# Patient Record
Sex: Female | Born: 1935 | Race: White | Hispanic: No | Marital: Married | Smoking: Former smoker
Health system: Southern US, Community
[De-identification: ages and names within clinical notes are randomized; demographics above are authoritative.]

## PROBLEM LIST (undated history)

## (undated) DIAGNOSIS — I10 Essential (primary) hypertension: Secondary | ICD-10-CM

## (undated) DIAGNOSIS — M2550 Pain in unspecified joint: Secondary | ICD-10-CM

## (undated) DIAGNOSIS — J84112 Idiopathic pulmonary fibrosis: Secondary | ICD-10-CM

## (undated) HISTORY — DX: Idiopathic pulmonary fibrosis: J84.112

## (undated) HISTORY — DX: Pain in unspecified joint: M25.50

## (undated) HISTORY — DX: Essential (primary) hypertension: I10

---

## 2017-11-15 DIAGNOSIS — R9389 Abnormal findings on diagnostic imaging of other specified body structures: Secondary | ICD-10-CM | POA: Insufficient documentation

## 2017-11-15 DIAGNOSIS — M2550 Pain in unspecified joint: Secondary | ICD-10-CM | POA: Insufficient documentation

## 2017-11-28 ENCOUNTER — Telehealth: Payer: Self-pay | Admitting: Internal Medicine

## 2017-11-28 NOTE — Telephone Encounter (Signed)
Patient calling to see if we can possibly schedule bronch same day as appt as she has transportation issues .  Please call

## 2017-11-28 NOTE — Telephone Encounter (Signed)
Returned call to patient and made aware that scheduling bronch for same day as appt is not an option. Explained we request a date that has to be available on hospital schedule. Patient verbalized understanding.

## 2017-12-04 ENCOUNTER — Ambulatory Visit: Payer: Medicare HMO | Admitting: Internal Medicine

## 2017-12-04 ENCOUNTER — Encounter: Payer: Self-pay | Admitting: *Deleted

## 2017-12-04 ENCOUNTER — Other Ambulatory Visit: Payer: Self-pay | Admitting: *Deleted

## 2017-12-04 ENCOUNTER — Encounter: Payer: Self-pay | Admitting: Internal Medicine

## 2017-12-04 VITALS — BP 126/78 | HR 121 | Resp 16 | Wt 162.0 lb

## 2017-12-04 DIAGNOSIS — M791 Myalgia, unspecified site: Secondary | ICD-10-CM

## 2017-12-04 DIAGNOSIS — J84112 Idiopathic pulmonary fibrosis: Secondary | ICD-10-CM | POA: Diagnosis not present

## 2017-12-04 NOTE — Patient Instructions (Addendum)
Will need to get a copy of your most recent CT chest on CD then will determine need for bronchoscopy.  We will call you after this is reviewed to discuss further plan.  What is idiopathic pulmonary fibrosis? Idiopathic pulmonary fibrosis (also called "IPF") is a lung disease that makes it hard to breathe. It damages the air sacs in your lungs that send oxygen to the blood. This damage causes the lungs to be stiff. It also makes it hard for oxygen to reach the blood. This makes people with IPF cough and get short of breath.  People who get IPF are usually older than 50. It is a very serious illness that cannot be cured and gets worse over time. In some people, IPF can stay stable for several years before getting worse, or get worse gradually. But in others it gets worse more quickly.  Why did I get idiopathic pulmonary fibrosis? Doctors do not know exactly how IPF starts. The risk of IPF is greater for people who:  ?Smoke or used to smoke  ?Have breathed in a lot of toxic chemicals or pollution  ?Have breathed in certain types of dust at work, over a long time  ?Have family members with pulmonary fibrosis  What are the symptoms of idiopathic pulmonary fibrosis? IPF can start very slowly, so it might not cause any symptoms at first. When it does, symptoms can include:  ?Shortness of breath during exercise or other physical activity  ?Dry cough  Are there tests for idiopathic pulmonary fibrosis? Yes. Doctors can do:  ?Blood tests to make sure you don't have a different type of lung disease - There is no blood test for IPF.  ?Breathing tests to see how well your lungs are working - Breathing tests can show if your shortness of breath is caused by IPF or another disease such as emphysema.  ?An imaging test called a "CT scan" - This test uses a special X-ray to create pictures of the inside of the body. It can show lung damage caused by IPF.  ?If the doctor is not sure you have IPF after  the CT scan, he or she might do a lung biopsy. In this test, a doctor does surgery to take a small sample of tissue from your lung. Another doctor looks at the sample under a microscope for signs of IPF.  How is idiopathic pulmonary fibrosis treated? There is no treatment to cure IPF. It usually gets worse slowly. But doctors can treat some of the symptoms. These treatments can include:  ?Quitting smoking - If you smoke cigarettes, the most important thing you can do is stop smoking.  ?Flu and pneumonia vaccines - You should get the flu shot every fall and the pneumonia vaccine at least once. Infections like the flu and pneumonia can hurt your lungs. It's important to try to prevent them.  ?Oxygen - As IPF gets worse, some people need to breathe oxygen from a tank they carry with them.  ?Pulmonary rehab - In pulmonary rehab, people learn exercises and ways to breathe that can help with IPF symptoms.  ?Medicine - Two medicines, nintedanib (brand name: Ofev) and pirfenidone (brand name: Esbriet), have been shown to slow lung damage. But they do not cure IPF.  ?Clinical trials - If you are interested, you can join a research study of new medicines that might help people with IPF.  ?Treatment for acid reflux - Acid reflux is when the acid that is normally in your stomach  backs up into your esophagus. The esophagus is the tube that carries food from your mouth to your stomach. People who have acid reflux might need medicine to stop the acid reflux from making IPF worse.  ?Lung transplant - This is surgery to replace 1 or both diseased lungs with healthy lungs. It is done only if a person with IPF meets certain conditions.  IPF sometimes gets worse very quickly, over a few days to weeks. If this happens, you should tell your doctor. Doctors can try to treat it with antibiotics and steroid medicines. (These are not the same as the steroids some athletes take illegally.)

## 2017-12-04 NOTE — H&P (View-Only) (Signed)
[facility] Consultation      Assessment and Plan:  Pulmonary fibrosis. -Etiology uncertain, however per CT report this is consistent with usual interstitial pneumonia pattern, consistent with idiopathic pulmonary fibrosis. -Patient is referred here for consideration of biopsy, she is very hesitant to undergo this procedure but is willing if we feel that results will alter her therapy. -I have asked her to obtain a CT on CD for me to review, after this is reviewed I will call her for setting up potentially of a bronchoscopy.  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 -Elevated p-ANCA which is concerning for vasculitis/Wegener's. Pt denies hemoptysis.  -Currently on prednisone 20 mg daily with resolution of cough and myalgias. - Patient is following up with Dr. Renard MatterBock , rheumatology.  Addendum 12/06/17: Outside noncontrasted CT chest images personally reviewed there are bibasilar subpleural honeycomb fibrotic changes consistent with pulmonary fibrosis, left base bullous changes, some subpleural upper lobe changes, as well as areas of groundglass changes, appear greatest in the lingula and right upper lobe. Discussed yesterday with rheum, will arrange for bronchoscopy with biopsy to r/o infection, other atypical causes of inflammation.    Date: 12/04/2017  MRN# [id number] [name] 08/27/1936  Referring Physician: Dr. Meredeth IdeFleming.   [name] is a 82 y.o. old female seen in consultation for chief complaint of:    Chief Complaint  Patient presents with  . Consult    referred by Dr. Meredeth IdeFleming lymph node, UIP work-up.  Marland Kitchen. Cough    sl. now that she takes Prednisone 20 mg daily.  . Shortness of Breath    with and without exertion    HPI:   The patient-year-old female ex-smoker, who moved here from [place] approximately 1 year ago.  Recently she underwent a CT of the chest she has been having symptoms of  cough and progressive dyspnea. CT of the chest was  suggestive of pulmonary fibrosis, she was seen by rheumatology, and noted to have an elevated p-ANCA and MPO levels, with concern for Wegener's granulomatosis.  She is therefore referred here for consideration of bronchoscopy. She notes that over the past 6 weeks she developed back and neck stiffness, significant muscle pain and stiffness.  This coincided with the development of a new cough, she saw her PCP and was suspected of having MCTD, she was referred to rheumatologist, serological workup was positive for p-ANCA.  In the meantime she has been started on a prednisone burst and been maintained on 20 mg daily.  She notes that since she was started on this her breathing, cough and muscle aches have significantly improved. Currently she describes as herself as very anxious.  She is worried that she might have to use oxygen.  She quit smoking 40 years ago.  [facility], and [facility]R was at [facility] was within the past month.  She walks her dog about 3 times per weekly. She also goes to the gym about 5 days per week, she is not limited by breathing.   **Review of pulmonary function test on 11/15/17 shows relatively well-preserved lung functions with mild restriction but severely reduced DLCO of 30%.  **Desat walk sat at rest on RA was 94% and HR 95; Walked 360 feet at moderate pace, no dyspnea, sat was 91% and HR 112.  **Report of CT chest 11/20/17 bilateral lower lobe predominant subpleural reticular pulmonary parenchymal opacities bronchiectasis, honeycombing noted, small hiatal hernia, mild cardiomegaly.  Subcentimeter mediastinal lymph nodes.  Consistent with UIP pattern of interstitial lung disease. **Serology TB QuantiFERON negative, sed rate elevated at 119, ACE level normal, rheumatoid factor negative, double-stranded DNA, normal.  Anti-RNP elevated at 265 (<100).  C-ANCA negative, anti-proteinase PR-3 negative, p-ANCA elevated at 1: 320 (<1:20).   PMHX:     History reviewed. No pertinent past medical history. Surgical Hx:  History reviewed. No pertinent surgical history. Family Hx:  History reviewed. No pertinent family history. Social Hx:   Social History   Tobacco Use  . Smoking status: Former Smoker    Packs/day: 1.00    Types: Cigarettes    Last attempt to quit: 09/06/1987    Years since quitting: 30.2  . Smokeless tobacco: Never Used  Substance Use Topics  . Alcohol use: Never    Frequency: Never  . Drug use: Never   Medication:    Current Outpatient Medications:  .  hydrochlorothiazide (HYDRODIURIL) 25 MG tablet, Take 25 mg by mouth daily., Disp: , Rfl: 0 .  predniSONE (DELTASONE) 20 MG tablet, Take 20 mg by mouth daily., Disp: , Rfl:    Allergies:  Patient has no known allergies.  Review of Systems: Gen:  Denies  fever, sweats, chills HEENT: Denies blurred vision, double vision. bleeds, sore throat Cvc:  No dizziness, chest pain. Resp:   Denies cough or sputum production, shortness of breath Gi: Denies swallowing difficulty, stomach pain. Gu:  Denies bladder incontinence, burning urine Ext:   No Joint pain, stiffness. Skin: No skin rash,  hives  Endoc:  No polyuria, polydipsia. Psych: No depression, insomnia. Other:  All other systems were reviewed with the patient and were negative other that what is mentioned in the HPI.   Physical Examination:   VS: BP 126/78 (BP Location: Left Arm, Cuff Size: Normal)   Pulse (!) 121   Resp 16   Wt 162 lb (73.5 kg)   SpO2 93%   General Appearance: No distress  Neuro:without focal findings,  speech normal,  HEENT: PERRLA, EOM intact.   Pulmonary: Scattered bibasilar crackles. CardiovascularNormal S1,S2.  No m/r/g.   Abdomen: Benign, Soft, non-tender. Renal:  No costovertebral tenderness  GU:  No performed at this time. Endoc: No evident thyromegaly, no signs of acromegaly. Skin:   warm, no rashes, no ecchymosis  Extremities: normal, no cyanosis, clubbing.  Other  findings:    LABORATORY PANEL:   CBC No results for input(s): WBC, HGB, HCT, PLT in the last 168 hours. ------------------------------------------------------------------------------------------------------------------  Chemistries  No results for input(s): NA, K, CL, CO2, GLUCOSE, BUN, CREATININE, CALCIUM, MG, AST, ALT, ALKPHOS, BILITOT in the last 168 hours.  Invalid input(s): GFRCGP ------------------------------------------------------------------------------------------------------------------  Cardiac Enzymes No results for input(s): TROPONINI in the last 168 hours. ------------------------------------------------------------  RADIOLOGY:  No results found.     Thank  you for the consultation and for allowing [facility] to assist in the care of your patient. Our recommendations are noted above.  Please contact us if we can be of further service.   Wells Guiles, MD.  Board Certified in Internal Medicine, Pulmonary Medicine, Critical Care Medicine, and Sleep Medicine.  [place] [facility] Office Number: [phone]  Santiago Glad, M.D.  Billy Fischer, M.D  12/04/2017

## 2017-12-04 NOTE — Progress Notes (Addendum)
[facility] Consultation      Assessment and Plan:  Pulmonary fibrosis. -Etiology uncertain, however per CT report this is consistent with usual interstitial pneumonia pattern, consistent with idiopathic pulmonary fibrosis. -Patient is referred here for consideration of biopsy, she is very hesitant to undergo this procedure but is willing if we feel that results will alter her therapy. -I have asked her to obtain a CT on CD for me to review, after this is reviewed I will call her for setting up potentially of a bronchoscopy.  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 -Elevated p-ANCA which is concerning for vasculitis/Wegener's. Pt denies hemoptysis.  -Currently on prednisone 20 mg daily with resolution of cough and myalgias. - Patient is following up with Dr. Renard MatterBock , rheumatology.  Addendum 12/06/17: Outside noncontrasted CT chest images personally reviewed there are bibasilar subpleural honeycomb fibrotic changes consistent with pulmonary fibrosis, left base bullous changes, some subpleural upper lobe changes, as well as areas of groundglass changes, appear greatest in the lingula and right upper lobe. Discussed yesterday with rheum, will arrange for bronchoscopy with biopsy to r/o infection, other atypical causes of inflammation.    Date: 12/04/2017  MRN# [id number] Alyssa Shannon 08/27/1936  Referring Physician: Dr. Meredeth IdeFleming.   Alyssa Shannon is a 82 y.o. old female seen in consultation for chief complaint of:    Chief Complaint  Patient presents with  . Consult    referred by Dr. Meredeth IdeFleming lymph node, UIP work-up.  Marland Kitchen. Cough    sl. now that she takes Prednisone 20 mg daily.  . Shortness of Breath    with and without exertion    HPI:   The patient-year-old female ex-smoker, who moved here from [place] approximately 1 year ago.  Recently she underwent a CT of the chest she has been having symptoms of  cough and progressive dyspnea. CT of the chest was  suggestive of pulmonary fibrosis, she was seen by rheumatology, and noted to have an elevated p-ANCA and MPO levels, with concern for Wegener's granulomatosis.  She is therefore referred here for consideration of bronchoscopy. She notes that over the past 6 weeks she developed back and neck stiffness, significant muscle pain and stiffness.  This coincided with the development of a new cough, she saw her PCP and was suspected of having MCTD, she was referred to rheumatologist, serological workup was positive for p-ANCA.  In the meantime she has been started on a prednisone burst and been maintained on 20 mg daily.  She notes that since she was started on this her breathing, cough and muscle aches have significantly improved. Currently she describes as herself as very anxious.  She is worried that she might have to use oxygen.  She quit smoking 40 years ago.  She had [facility], and CXR was at [facility] [place], [place] was within the past month.  She walks her dog about 3 times per weekly. She also goes to the gym about 5 days per week, she is not limited by breathing.   **Review of pulmonary function test on 11/15/17 shows relatively well-preserved lung functions with mild restriction but severely reduced DLCO of 30%.  **Desat walk sat at rest on RA was 94% and HR 95; Walked 360 feet at moderate pace, no dyspnea, sat was 91% and HR 112.  **Report of CT chest 11/20/17 bilateral lower lobe predominant subpleural reticular pulmonary parenchymal opacities bronchiectasis, honeycombing noted, small hiatal hernia, mild cardiomegaly.  Subcentimeter mediastinal lymph nodes.  Consistent with UIP pattern of interstitial lung disease. **Serology TB QuantiFERON negative, sed rate elevated at 119, ACE level normal, rheumatoid factor negative, double-stranded DNA, normal.  Anti-RNP elevated at 265 (<100).  C-ANCA negative, anti-proteinase PR-3 negative, p-ANCA elevated at 1: 320 (<1:20).   PMHX:     History reviewed. No pertinent past medical history. Surgical Hx:  History reviewed. No pertinent surgical history. Family Hx:  History reviewed. No pertinent family history. Social Hx:   Social History   Tobacco Use  . Smoking status: Former Smoker    Packs/day: 1.00    Types: Cigarettes    Last attempt to quit: 09/06/1987    Years since quitting: 30.2  . Smokeless tobacco: Never Used  Substance Use Topics  . Alcohol use: Never    Frequency: Never  . Drug use: Never   Medication:    Current Outpatient Medications:  .  hydrochlorothiazide (HYDRODIURIL) 25 MG tablet, Take 25 mg by mouth daily., Disp: , Rfl: 0 .  predniSONE (DELTASONE) 20 MG tablet, Take 20 mg by mouth daily., Disp: , Rfl:    Allergies:  Patient has no known allergies.  Review of Systems: Gen:  Denies  fever, sweats, chills HEENT: Denies blurred vision, double vision. bleeds, sore throat Cvc:  No dizziness, chest pain. Resp:   Denies cough or sputum production, shortness of breath Gi: Denies swallowing difficulty, stomach pain. Gu:  Denies bladder incontinence, burning urine Ext:   No Joint pain, stiffness. Skin: No skin rash,  hives  Endoc:  No polyuria, polydipsia. Psych: No depression, insomnia. Other:  All other systems were reviewed with the patient and were negative other that what is mentioned in the HPI.   Physical Examination:   VS: BP 126/78 (BP Location: Left Arm, Cuff Size: Normal)   Pulse (!) 121   Resp 16   Wt 162 lb (73.5 kg)   SpO2 93%   General Appearance: No distress  Neuro:without focal findings,  speech normal,  HEENT: PERRLA, EOM intact.   Pulmonary: Scattered bibasilar crackles. CardiovascularNormal S1,S2.  No m/r/g.   Abdomen: Benign, Soft, non-tender. Renal:  No costovertebral tenderness  GU:  No performed at this time. Endoc: No evident thyromegaly, no signs of acromegaly. Skin:   warm, no rashes, no ecchymosis  Extremities: normal, no cyanosis, clubbing.  Other  findings:    LABORATORY PANEL:   CBC No results for input(s): WBC, HGB, HCT, PLT in the last 168 hours. ------------------------------------------------------------------------------------------------------------------  Chemistries  No results for input(s): NA, K, CL, CO2, GLUCOSE, BUN, CREATININE, CALCIUM, MG, AST, ALT, ALKPHOS, BILITOT in the last 168 hours.  Invalid input(s): GFRCGP ------------------------------------------------------------------------------------------------------------------  Cardiac Enzymes No results for input(s): TROPONINI in the last 168 hours. ------------------------------------------------------------  RADIOLOGY:  No results found.     Thank  you for the consultation and for allowing [facility] to assist in the care of your patient. Our recommendations are noted above.  Please contact us if we can be of further service.   Wells Guiles, MD.  Board Certified in Internal Medicine, Pulmonary Medicine, Critical Care Medicine, and Sleep Medicine.  [place] [facility] Office Number: [phone]  Santiago Glad, M.D.  Billy Fischer, M.D  12/04/2017

## 2017-12-06 ENCOUNTER — Telehealth: Payer: Self-pay | Admitting: *Deleted

## 2017-12-06 NOTE — Telephone Encounter (Signed)
-----   Message from Shane CrutchPradeep Ramachandran, MD sent at 12/06/2017  1:56 PM EDT ----- Regarding: bronchoscopy Reviewed CT scans. Will need bronchoscopy Non-EBUS, with fluoro. Orders placed.

## 2017-12-06 NOTE — Addendum Note (Signed)
Addended by: Shane CrutchAMACHANDRAN, Drina Jobst on: 12/06/2017 01:55 PM   Modules accepted: Orders, SmartSet

## 2017-12-06 NOTE — Telephone Encounter (Signed)
PROVIDER: Nicholos Johnsamachandran    PROCEDURE: regular DATE: 12/12/17 TIME: 1pm

## 2017-12-07 NOTE — Telephone Encounter (Signed)
Called [employer] per [employer] No PA required for procedure code [id number]. Call Ref # [id number] [id number]. Rhonda J Cobb

## 2017-12-10 ENCOUNTER — Telehealth: Payer: Self-pay | Admitting: Internal Medicine

## 2017-12-10 NOTE — Telephone Encounter (Signed)
Patient wants to schedule bronch if she can do this Friday she has transportation .  Please call.

## 2017-12-10 NOTE — Telephone Encounter (Signed)
Spoke with pt and informed her that her Regular bronch has been rescheduled for 12/18/17 and she must [facility] at 12[facility]. Nothing further needed.

## 2017-12-18 ENCOUNTER — Ambulatory Visit: Payer: Medicare HMO

## 2017-12-18 ENCOUNTER — Encounter
Admission: RE | Disposition: A | Discharge status: Home / Self Care | Payer: Self-pay | Source: Ambulatory Visit | Attending: Internal Medicine

## 2017-12-18 ENCOUNTER — Ambulatory Visit
Admission: RE | Admit: 2017-12-18 | Discharge: 2017-12-18 | Disposition: A | Discharge status: Home / Self Care | Payer: Medicare HMO | Source: Ambulatory Visit | Attending: Internal Medicine | Admitting: Internal Medicine

## 2017-12-18 DIAGNOSIS — Z79899 Other long term (current) drug therapy: Secondary | ICD-10-CM | POA: Insufficient documentation

## 2017-12-18 DIAGNOSIS — J84112 Idiopathic pulmonary fibrosis: Secondary | ICD-10-CM

## 2017-12-18 DIAGNOSIS — J209 Acute bronchitis, unspecified: Secondary | ICD-10-CM | POA: Insufficient documentation

## 2017-12-18 DIAGNOSIS — J8410 Pulmonary fibrosis, unspecified: Secondary | ICD-10-CM | POA: Insufficient documentation

## 2017-12-18 DIAGNOSIS — Z7952 Long term (current) use of systemic steroids: Secondary | ICD-10-CM | POA: Insufficient documentation

## 2017-12-18 DIAGNOSIS — R790 Abnormal level of blood mineral: Secondary | ICD-10-CM | POA: Insufficient documentation

## 2017-12-18 DIAGNOSIS — Z9889 Other specified postprocedural states: Secondary | ICD-10-CM

## 2017-12-18 DIAGNOSIS — Z87891 Personal history of nicotine dependence: Secondary | ICD-10-CM | POA: Insufficient documentation

## 2017-12-18 HISTORY — PX: FLEXIBLE BRONCHOSCOPY: SHX5094

## 2017-12-18 HISTORY — DX: Essential (primary) hypertension: I10

## 2017-12-18 SURGERY — BRONCHOSCOPY, FLEXIBLE
Anesthesia: Moderate Sedation | Laterality: Left

## 2017-12-18 MED ORDER — FENTANYL CITRATE (PF) 100 MCG/2ML IJ SOLN
INTRAMUSCULAR | Status: AC | PRN
  Administered 2017-12-18 (×6): 25 ug via INTRAVENOUS

## 2017-12-18 MED ORDER — LIDOCAINE HCL 2 % EX GEL
1.0000 "application " | Freq: Once | CUTANEOUS | Status: DC
  Filled 2017-12-18: qty 5

## 2017-12-18 MED ORDER — PHENYLEPHRINE HCL 0.25 % NA SOLN
1.0000 | Freq: Four times a day (QID) | NASAL | Status: DC | PRN
  Filled 2017-12-18: qty 15

## 2017-12-18 MED ORDER — FENTANYL CITRATE (PF) 100 MCG/2ML IJ SOLN
INTRAMUSCULAR | Status: AC
  Filled 2017-12-18: qty 4

## 2017-12-18 MED ORDER — MIDAZOLAM HCL 5 MG/5ML IJ SOLN
INTRAMUSCULAR | Status: AC | PRN
  Administered 2017-12-18 (×6): 1 mg via INTRAVENOUS

## 2017-12-18 MED ORDER — BUTAMBEN-TETRACAINE-BENZOCAINE 2-2-14 % EX AERO
1.0000 | INHALATION_SPRAY | Freq: Once | CUTANEOUS | Status: DC
  Filled 2017-12-18: qty 20

## 2017-12-18 MED ORDER — MIDAZOLAM HCL 5 MG/5ML IJ SOLN
INTRAMUSCULAR | Status: AC
  Filled 2017-12-18: qty 10

## 2017-12-18 NOTE — Procedures (Signed)
  [place] [facility]            Bronchoscopy Note   FINDINGS/SUMMARY:  -Findings of severe erythema/bronchitis throughout both airways. -An area of mildly abnormal/thickened mucosa was found in the right lower lobe, endobronchial biopsies were taken at this location. - Bronchoalveolar lavage was taken of the right middle lobe. - Transbronchial forceps biopsies, transbronchial brushings were taken of the right upper lobe. -Crowded posterior pharynx suspicious for obstructive sleep apnea.  Indication: pulmonary fibrosis.  The patient (or their representative) was informed of the risks (including but not limited to bleeding, infection, respiratory failure, lung injury, tooth/oral injury) and benefits of the procedure and gave consent, see chart.   Pre-op diagnosis: Pulmonary fibrosis Post-op diagnosis: Same with acute bronchitis. Estimated blood loss: Minimal  Medications for procedure: Patient was treated with conscious sedation, received a total of 6 mg of Versed, 100 mg of fentanyl.  I was present and supervised conscious sedation for the duration of the procedure total sedation time was 30 minutes.  Procedure description: After obtaining informed consent, timeout was called to confirm the patient and the procedure.  Right and left nares were checked for patency, the left nares appear to be more patent and was anesthetized with topical lidocaine.  Bronchoscope was passed via the left nares to the posterior pharynx, narrow opening of the posterior pharynx and the vocal cords was noted, likely consistent with obstructive sleep apnea. An anatomical tumor was undertaken, there was moderate mucosal erythema, there was bilateral mucosal secretions which were suctioned.  There was some thickened mucosa at the entrance to the basilar segments of the right lower lobe, endobronchial forceps biopsies were taken from here x2. Subsequently bronchoscope was taken of the right middle lobe,  bronchoscope was wedged in the middle lobe, bronchoalveolar lavage was performed from the right middle lobe.  Right skin was then taken to the right upper lobe, anterior segment.  Under fluoroscopic guidance transbronchial cytology brushings were taken, as well as transbronchial forceps biopsies under fluoroscopic guidance.  As adequate tissue sampling had been performed at that time, the bronchoscope was removed, the patient was taken to recovery.  No complications were noted.   Condition post procedure: Stable   Complications: None noted  Patient instructions: Patient is asked to follow-up with Dr. Raul Del and Dr. Meda Coffee for results.   Marda Stalker, MD.  Board Certified in Internal Medicine, Pulmonary Medicine, [place], and Sleep Medicine.   Pulmonary and Critical Care Office Number: [phone]  Shaneil Pesa, M.D.  Vilinda Boehringer, M.D.  Cheral Marker, M.D  12/18/2017

## 2017-12-18 NOTE — Progress Notes (Signed)
Pt CXR completed at bedside. Dr. Nicholos Johnsamachandran paged, MD reviewed CXR. Per MD ok to d/c home once PO2 weaned and stable.

## 2017-12-18 NOTE — Interval H&P Note (Signed)
History and Physical Interval Note:  12/18/2017 12:11 PM  Alyssa Shannon  has presented today for surgery, with the diagnosis of Reg Bronchoscopy  Cough  [facility] emailed  cc: [name]  The various methods of treatment have been discussed with the patient and family. After consideration of risks, benefits and other options for treatment, the patient has consented to  Procedure(s): FLEXIBLE BRONCHOSCOPY (Left) as a surgical intervention .  The patient's history has been reviewed, patient examined, no change in status, stable for surgery.  I have reviewed the patient's chart and labs.  Questions were answered to the patient's satisfaction.     [name]

## 2017-12-19 ENCOUNTER — Encounter: Payer: Self-pay | Admitting: Internal Medicine

## 2017-12-19 LAB — ACID FAST SMEAR (AFB, MYCOBACTERIA): Acid Fast Smear: NEGATIVE

## 2017-12-19 LAB — ACID FAST SMEAR (AFB)

## 2017-12-20 LAB — SURGICAL PATHOLOGY

## 2017-12-20 LAB — CYTOLOGY - NON PAP

## 2017-12-20 LAB — CULTURE, BAL-QUANTITATIVE W GRAM STAIN: Culture: 50000 — AB

## 2017-12-20 LAB — CULTURE, BAL-QUANTITATIVE

## 2017-12-25 ENCOUNTER — Telehealth: Payer: Self-pay | Admitting: Cardiothoracic Surgery

## 2017-12-25 ENCOUNTER — Telehealth: Payer: Self-pay | Admitting: Internal Medicine

## 2017-12-25 ENCOUNTER — Other Ambulatory Visit: Payer: Self-pay

## 2017-12-25 DIAGNOSIS — J84112 Idiopathic pulmonary fibrosis: Secondary | ICD-10-CM

## 2017-12-25 DIAGNOSIS — R768 Other specified abnormal immunological findings in serum: Secondary | ICD-10-CM | POA: Insufficient documentation

## 2017-12-25 DIAGNOSIS — I10 Essential (primary) hypertension: Secondary | ICD-10-CM | POA: Insufficient documentation

## 2017-12-25 HISTORY — DX: Idiopathic pulmonary fibrosis: J84.112

## 2017-12-25 HISTORY — DX: Essential (primary) hypertension: I10

## 2017-12-25 NOTE — Telephone Encounter (Signed)
Follow up  [facility] called for patient stating patient cannot get records and see if we can call MR to obtain copy.

## 2017-12-25 NOTE — Telephone Encounter (Signed)
Called [facility] imaging to see if they still had disc that was dropped of to be scanned into patient chart from [facility]. Spoke with French Anaracy who states they don't have disc.  Called patient to see if she can obtain another copy before appointment with Dr. Thelma Bargeaks on Thursday. Patient states she will try her best to obtain another copy.  Nothing further needed.

## 2017-12-25 NOTE — Telephone Encounter (Signed)
New Message  Maritza from [facility] wanting to know if they can get CD of patient's CT Scan.  Please f/u

## 2017-12-26 ENCOUNTER — Ambulatory Visit: Payer: Medicare HMO

## 2017-12-26 NOTE — Telephone Encounter (Signed)
Pt notified that should will need another CT scan if she if not able to get 11/14/17 Ct scan on disc.  She states she will get her husband to obtain disc for her. Nothing further needed.

## 2017-12-27 ENCOUNTER — Encounter: Payer: Self-pay | Admitting: Cardiothoracic Surgery

## 2017-12-27 ENCOUNTER — Ambulatory Visit
Admission: RE | Admit: 2017-12-27 | Discharge: 2017-12-27 | Disposition: A | Discharge status: Home / Self Care | Payer: Self-pay | Source: Ambulatory Visit | Attending: Cardiothoracic Surgery | Admitting: Cardiothoracic Surgery

## 2017-12-27 ENCOUNTER — Ambulatory Visit (INDEPENDENT_AMBULATORY_CARE_PROVIDER_SITE_OTHER): Payer: Medicare HMO | Admitting: Cardiothoracic Surgery

## 2017-12-27 ENCOUNTER — Other Ambulatory Visit: Payer: Self-pay | Admitting: Cardiothoracic Surgery

## 2017-12-27 VITALS — BP 162/89 | HR 88 | Temp 97.5°F | Resp 18 | Ht 62.0 in | Wt 164.4 lb

## 2017-12-27 DIAGNOSIS — J84112 Idiopathic pulmonary fibrosis: Secondary | ICD-10-CM

## 2017-12-27 DIAGNOSIS — Z9289 Personal history of other medical treatment: Secondary | ICD-10-CM

## 2017-12-27 NOTE — Progress Notes (Signed)
Patient ID: Alyssa Shannon, female   DOB: October 22, 1935, 82 y.o.   MRN: [id number]  Chief Complaint  Patient presents with  . New Patient (Initial Visit)    Idiopathic Pulmonary Fibrosis-referred by Dr.Flemmimg    Referred By Dr. Raul Del Reason for Referral thoracoscopic lung biopsy HPI Location, Quality, Duration, Severity, Timing, Context, Modifying Factors, Associated Signs and Symptoms.  Alyssa Shannon is a 82 y.o. female.  Her problems began back in December when she came back from a trip.  She started noticing that she was increasingly short of breath and thought she had a cold.  She went to her primary care physician and received symptomatic therapy but then over the course of a couple weeks she began experiencing joint pains as well as pains in her neck.  She was ultimately seen by rheumatologist and a rheumatologic work-up ensued.  She did have evidence of autoimmune disease and as part of her therapy received a chest x-ray and CT scan.  The CT scan was consistent with IPF and a subsequent referral to Dr. Raul Del resulted in pulmonary function studies revealing a markedly diminished DLCO.  This was all consistent with her diagnosis of UIP.  She is here today to discuss lung biopsy.  She is previously undergone a transbronchial lung biopsy which was negative for any evidence of vasculitis.  This was performed by Dr. Ashby Dawes approximately 3 weeks ago.  She was subsequently placed on prednisone and states that she feels much better on that medication and that her shortness of breath has improved.   Past Medical History:  Diagnosis Date  . Arthralgia   . Essential hypertension 12/25/2017  . Hypertension   . Idiopathic pulmonary fibrosis ([place]) 12/25/2017    Past Surgical History:  Procedure Laterality Date  . FLEXIBLE BRONCHOSCOPY Left 12/18/2017   Procedure: FLEXIBLE BRONCHOSCOPY;  Surgeon: [name], MD;  Location: [facility] ORS;  Service: Pulmonary;  Laterality: Left;     History reviewed. No pertinent family history.  Social History Social History   Tobacco Use  . Smoking status: Former Smoker    Packs/day: 1.00    Types: Cigarettes    Last attempt to quit: 09/06/1987    Years since quitting: 30.3  . Smokeless tobacco: Never Used  Substance Use Topics  . Alcohol use: Never    Frequency: Never  . Drug use: Never    No Known Allergies  Current Outpatient Medications  Medication Sig Dispense Refill  . hydrochlorothiazide (HYDRODIURIL) 25 MG tablet Take 25 mg by mouth daily.  0  . Multiple Vitamins-Minerals (MULTIVITAMIN PO) Take 1 tablet by mouth daily.    . predniSONE (DELTASONE) 20 MG tablet Take 20 mg by mouth daily.     No current facility-administered medications for this visit.       Review of Systems A complete review of systems was asked and was negative except for the following positive findings all review of systems were asked and were negative.  The only thing she did endorse with shortness of breath with exertion.  Blood pressure (!) 162/89, pulse 88, temperature (!) 97.5 F (36.4 C), temperature source Oral, resp. rate 18, height _0  (1.575 m), weight 164 lb 6.4 oz (74.6 kg), SpO2 91 %.  Physical Exam CONSTITUTIONAL:  Pleasant, well-developed, well-nourished, and in no acute distress. EYES: Pupils equal and reactive to light, Sclera non-icteric EARS, NOSE, MOUTH AND THROAT:  The oropharynx was clear.  Dentition is absent.  Oral mucosa pink and moist. LYMPH NODES:  Lymph  nodes in the neck and axillae were normal RESPIRATORY:  Lungs were clear except at both bases were there were typical Velcro rales.  Normal respiratory effort without pathologic use of accessory muscles of respiration CARDIOVASCULAR: Heart was regular without murmurs.  There were no carotid bruits. GI: The abdomen was soft, nontender, and nondistended. There were no palpable masses. There was no hepatosplenomegaly. There were normal bowel sounds in all  quadrants. GU:  Rectal deferred.   MUSCULOSKELETAL:  Normal muscle strength and tone.  No clubbing or cyanosis.   SKIN:  There were no pathologic skin lesions.  There were no nodules on palpation. NEUROLOGIC:  Sensation is normal.  Cranial nerves are grossly intact. PSYCH:  Oriented to person, place and time.  Mood and affect are normal.  Data Reviewed Chest x-rays and CT scans  I have personally reviewed the patient's imaging, laboratory findings and medical records.    Assessment    I have independently reviewed the patient's CT scan.  I believe it is consistent with UIP.    Plan    I had a long discussion with her and her family today regarding indications and risks of thoracoscopic lung biopsy.  Risks of bleeding, infection, air leak and death were all reviewed.  They asked a lot of questions about her underlying disease prognosis.  I told them that Dr. Raul Del would be the one to make those recommendations and prognosis.  I believe that all their questions were answered.  I did tell them I would like to have another CT scan if they choose to move forward with the lung biopsy so we can have a better idea as to where to go for the biopsies themselves.  They will think about their options and they will be back in touch if they would like to proceed.  Thank you very much for your kind referral       Nestor Lewandowsky, MD 12/27/2017, 11:30 AM

## 2017-12-27 NOTE — Patient Instructions (Signed)
Please call our office if you decide to move forward with having the Lung Biopsy.  [phone]

## 2018-01-09 LAB — CULTURE, FUNGUS WITHOUT SMEAR

## 2018-01-16 ENCOUNTER — Ambulatory Visit
Admission: RE | Admit: 2018-01-16 | Discharge: 2018-01-16 | Disposition: A | Discharge status: Home / Self Care | Payer: Self-pay | Source: Ambulatory Visit | Attending: Cardiothoracic Surgery | Admitting: Cardiothoracic Surgery

## 2018-01-16 ENCOUNTER — Other Ambulatory Visit: Payer: Self-pay | Admitting: Cardiothoracic Surgery

## 2018-01-16 DIAGNOSIS — J84112 Idiopathic pulmonary fibrosis: Secondary | ICD-10-CM

## 2018-01-31 LAB — ACID FAST CULTURE WITH REFLEXED SENSITIVITIES (MYCOBACTERIA): Acid Fast Culture: NEGATIVE

## 2019-10-17 IMAGING — DX DG CHEST 1V
1 series · 1 of 1 positions shown · non-contrast
Comparison: None.

CLINICAL DATA: Status post bronchoscopy

EXAM:
CHEST  1 VIEW

[chest ap]
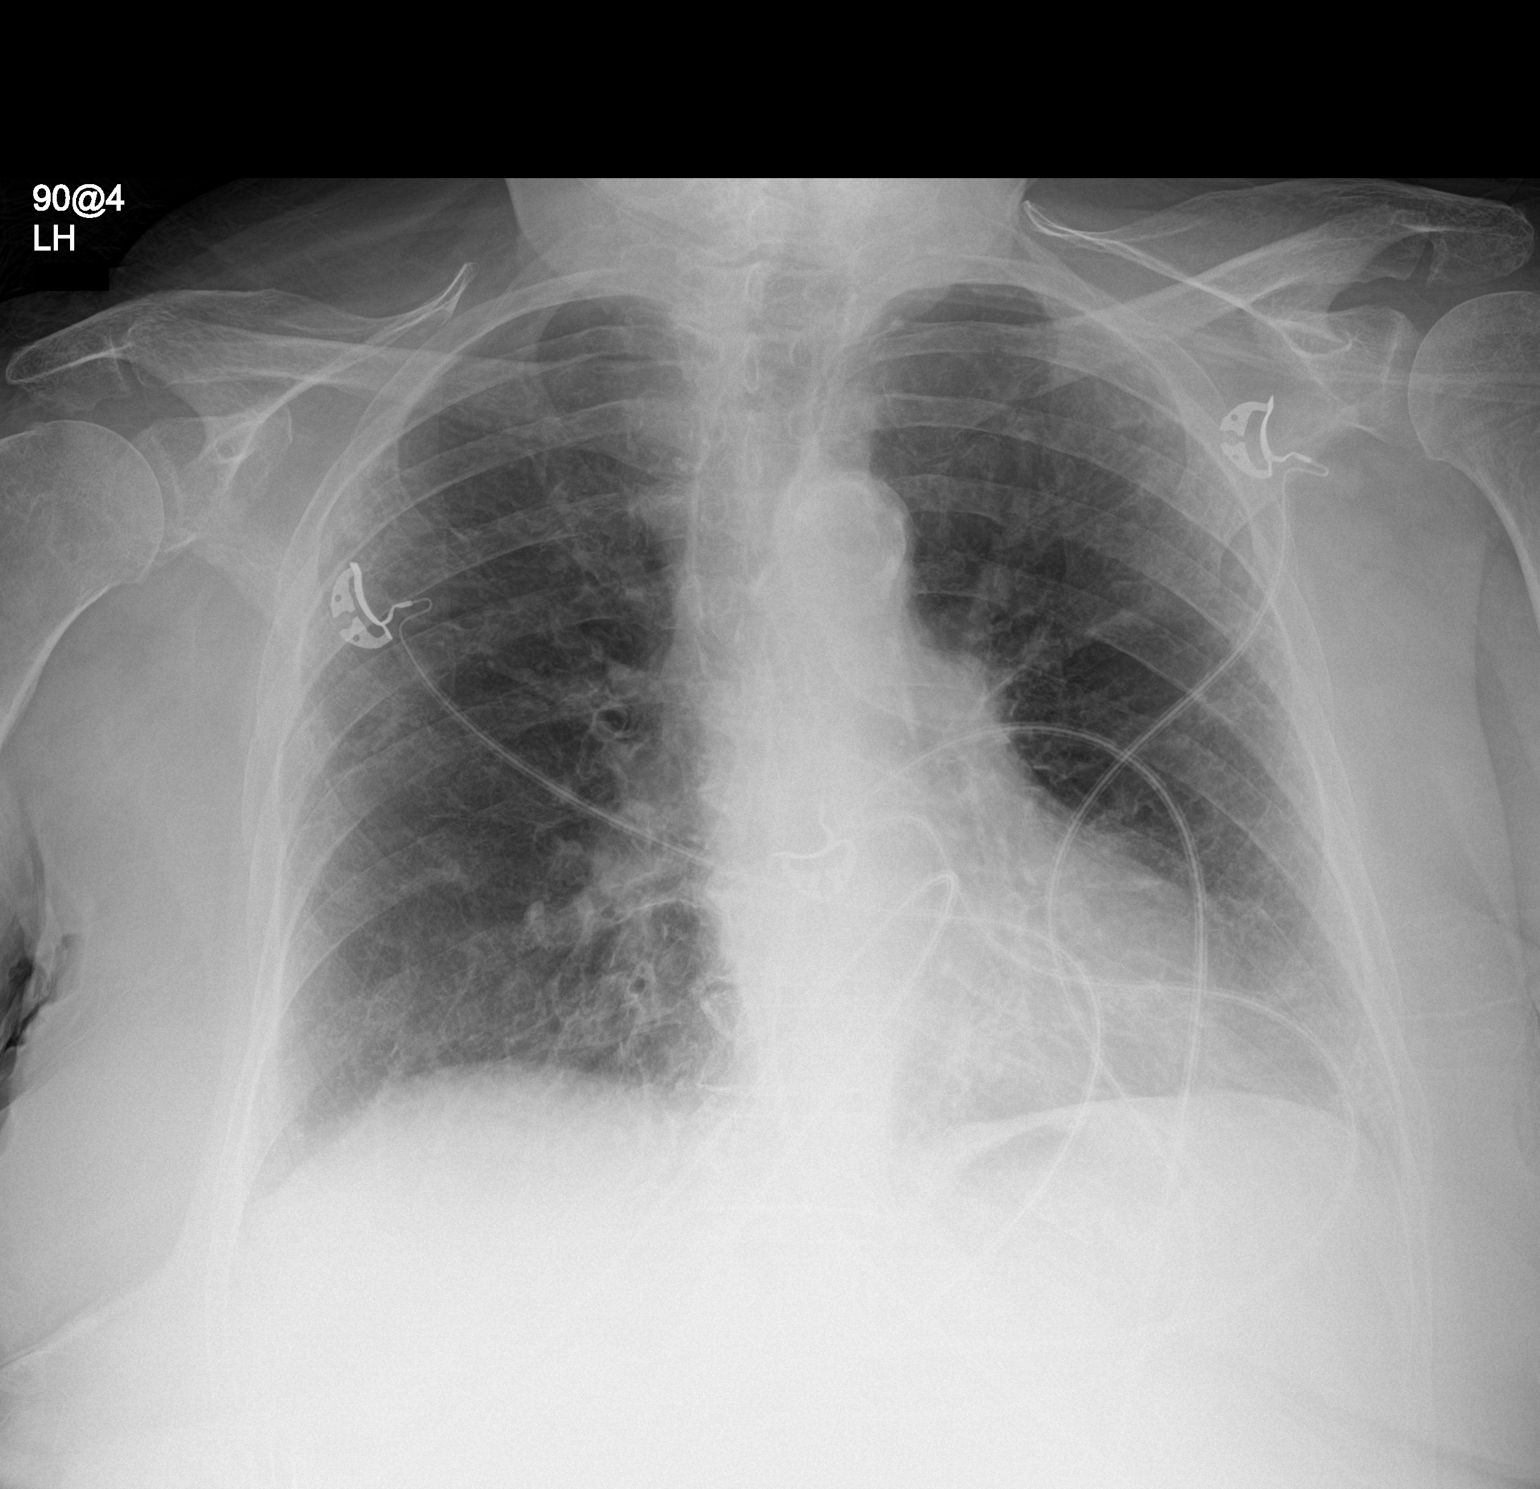

[1 of 1 positions shown; findings below may reference images not displayed]

FINDINGS: There is no evident pneumothorax. There is evidence of fibrotic type
change throughout the lungs bilaterally, most notably in the lung
bases but present throughout the lungs bilaterally. There is no
consolidation or volume loss. The heart size and pulmonary
vascularity are normal. No adenopathy. There is aortic
atherosclerosis. No evident bone lesions..
IMPRESSION: Fibrotic change bilaterally without frank consolidation or evident
parenchymal lung mass by radiography. No adenopathy evident. No
pneumothorax. There is aortic atherosclerosis.

Aortic Atherosclerosis ([id number]).
